# Patient Record
Sex: Male | Born: 1990 | Race: White | Hispanic: No | Marital: Single | Smoking: Current every day smoker
Health system: Southern US, Community
[De-identification: ages and names within clinical notes are randomized; demographics above are authoritative.]

## PROBLEM LIST (undated history)

## (undated) DIAGNOSIS — F329 Major depressive disorder, single episode, unspecified: Secondary | ICD-10-CM

## (undated) DIAGNOSIS — F32A Depression, unspecified: Secondary | ICD-10-CM

## (undated) HISTORY — DX: Depression, unspecified: F32.A

---
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
## 2018-08-02 ENCOUNTER — Other Ambulatory Visit: Payer: Self-pay

## 2018-08-02 ENCOUNTER — Emergency Department
Admission: EM | Admit: 2018-08-02 | Discharge: 2018-08-03 | Disposition: A | Discharge status: Home / Self Care | Payer: BC Managed Care – PPO | Attending: Student in an Organized Health Care Education/Training Program | Admitting: Student in an Organized Health Care Education/Training Program

## 2018-08-02 ENCOUNTER — Emergency Department: Payer: BC Managed Care – PPO

## 2018-08-02 DIAGNOSIS — W1839XA Other fall on same level, initial encounter: Secondary | ICD-10-CM | POA: Insufficient documentation

## 2018-08-02 DIAGNOSIS — E860 Dehydration: Secondary | ICD-10-CM

## 2018-08-02 DIAGNOSIS — R55 Syncope and collapse: Secondary | ICD-10-CM | POA: Insufficient documentation

## 2018-08-02 DIAGNOSIS — S46811A Strain of other muscles, fascia and tendons at shoulder and upper arm level, right arm, initial encounter: Secondary | ICD-10-CM | POA: Insufficient documentation

## 2018-08-02 DIAGNOSIS — S46911A Strain of unspecified muscle, fascia and tendon at shoulder and upper arm level, right arm, initial encounter: Secondary | ICD-10-CM

## 2018-08-02 DIAGNOSIS — F17210 Nicotine dependence, cigarettes, uncomplicated: Secondary | ICD-10-CM | POA: Insufficient documentation

## 2018-08-02 DIAGNOSIS — R05 Cough: Secondary | ICD-10-CM | POA: Insufficient documentation

## 2018-08-02 DIAGNOSIS — R1010 Upper abdominal pain, unspecified: Secondary | ICD-10-CM | POA: Insufficient documentation

## 2018-08-02 DIAGNOSIS — R059 Cough, unspecified: Secondary | ICD-10-CM

## 2018-08-02 DIAGNOSIS — Z716 Tobacco abuse counseling: Secondary | ICD-10-CM

## 2018-08-02 DIAGNOSIS — Y9301 Activity, walking, marching and hiking: Secondary | ICD-10-CM | POA: Insufficient documentation

## 2018-08-02 DIAGNOSIS — R112 Nausea with vomiting, unspecified: Secondary | ICD-10-CM | POA: Insufficient documentation

## 2018-08-02 LAB — CBC AND DIFFERENTIAL
Absolute NRBC: 0 10*3/uL (ref 0.00–0.00)
Basophils Absolute Automated: 0.13 10*3/uL — ABNORMAL HIGH (ref 0.00–0.08)
Basophils Automated: 0.8 %
Eosinophils Absolute Automated: 0.54 10*3/uL — ABNORMAL HIGH (ref 0.00–0.44)
Eosinophils Automated: 3.5 %
Hematocrit: 50.4 % — ABNORMAL HIGH (ref 37.6–49.6)
Hgb: 17.6 g/dL — ABNORMAL HIGH (ref 12.5–17.1)
Immature Granulocytes Absolute: 0.05 10*3/uL (ref 0.00–0.07)
Immature Granulocytes: 0.3 %
Lymphocytes Absolute Automated: 3.44 10*3/uL — ABNORMAL HIGH (ref 0.42–3.22)
Lymphocytes Automated: 22.5 %
MCH: 29.2 pg (ref 25.1–33.5)
MCHC: 34.9 g/dL (ref 31.5–35.8)
MCV: 83.7 fL (ref 78.0–96.0)
MPV: 9.4 fL (ref 8.9–12.5)
Monocytes Absolute Automated: 1.1 10*3/uL — ABNORMAL HIGH (ref 0.21–0.85)
Monocytes: 7.2 %
Neutrophils Absolute: 10.05 10*3/uL — ABNORMAL HIGH (ref 1.10–6.33)
Neutrophils: 65.7 %
Nucleated RBC: 0 /100 WBC (ref 0.0–0.0)
Platelets: 245 10*3/uL (ref 142–346)
RBC: 6.02 10*6/uL — ABNORMAL HIGH (ref 4.20–5.90)
RDW: 13 % (ref 11–15)
WBC: 15.31 10*3/uL — ABNORMAL HIGH (ref 3.10–9.50)

## 2018-08-02 LAB — BASIC METABOLIC PANEL
Anion Gap: 17 — ABNORMAL HIGH (ref 5.0–15.0)
BUN: 9 mg/dL (ref 9–28)
CO2: 18 mEq/L — ABNORMAL LOW (ref 22–29)
Calcium: 9.9 mg/dL (ref 8.5–10.5)
Chloride: 104 mEq/L (ref 100–111)
Creatinine: 1.1 mg/dL (ref 0.7–1.3)
Glucose: 112 mg/dL — ABNORMAL HIGH (ref 70–100)
Potassium: 4.1 mEq/L (ref 3.5–5.1)
Sodium: 139 mEq/L (ref 136–145)

## 2018-08-02 LAB — HEPATIC FUNCTION PANEL
ALT: 49 U/L (ref 0–55)
AST (SGOT): 29 U/L (ref 5–34)
Albumin/Globulin Ratio: 1.2 (ref 0.9–2.2)
Albumin: 4.6 g/dL (ref 3.5–5.0)
Alkaline Phosphatase: 128 U/L — ABNORMAL HIGH (ref 38–106)
Bilirubin Direct: 0.2 mg/dL (ref 0.0–0.5)
Bilirubin Indirect: 0.6 mg/dL (ref 0.2–1.0)
Bilirubin, Total: 0.8 mg/dL (ref 0.2–1.2)
Globulin: 3.7 g/dL — ABNORMAL HIGH (ref 2.0–3.6)
Protein, Total: 8.3 g/dL (ref 6.0–8.3)

## 2018-08-02 LAB — TROPONIN I: Troponin I: <0.01 ng/mL (ref 0.00–0.05)

## 2018-08-02 LAB — GFR: EGFR: >60

## 2018-08-02 LAB — LIPASE: Lipase: 18 U/L (ref 8–78)

## 2018-08-02 MED ORDER — SODIUM CHLORIDE 0.9 % IV BOLUS
1000.00 mL | Freq: Once | INTRAVENOUS | Status: AC
Start: 2018-08-02 — End: 2018-08-03
  Administered 2018-08-02: 23:00:00 1000 mL via INTRAVENOUS

## 2018-08-02 MED ORDER — ONDANSETRON HCL 4 MG/2ML IJ SOLN
4.00 mg | Freq: Once | INTRAMUSCULAR | Status: AC
Start: 2018-08-02 — End: 2018-08-02
  Administered 2018-08-02: 23:00:00 4 mg via INTRAVENOUS
  Filled 2018-08-02: qty 2

## 2018-08-02 MED ORDER — ACETAMINOPHEN 500 MG PO TABS
1000.0000 mg | ORAL_TABLET | Freq: Once | ORAL | Status: AC
Start: 2018-08-02 — End: 2018-08-02
  Administered 2018-08-02: 23:00:00 1000 mg via ORAL
  Filled 2018-08-02: qty 2

## 2018-08-02 NOTE — ED Provider Notes (Signed)
None        History     Chief Complaint   Patient presents with    Syncope    Emesis    Abdominal Pain     Pt with hx 18 yr smoker, chronic cough, presents with nausea, vomiting and syncopal episode today. He started feeling nauseous this morning. He  notes ~5 episodes of nonbloody nonbloody emesis and diffuse abd pain (greatest in RUQ). He has had a chronic cough for years which can be dry and/or productive. He thinks his cough was worsened slightly recently. He was having a coughing fit while walking to the bathroom and had a syncopal episode. He landed on his right side and notes Rt elbow pain. Unknown head injury but he denies scalp pain or wounds. He has chronic chest pain and notes the left parasternal region was painful today with coughing fits. No SOB or wheezing. No fever or bodyaches. No flank pain, urinary sxs or diarrhea. 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 No known sick contacts. No hx cardiac dz, MI, HTN, HLD, DM. No hx DVT/PE, leg edema. Pt lives 4 hours away and is here for work. He took Ibuprofen this morning, nothing this evening.     PCP: None    The history is provided by the patient.            History reviewed. No pertinent past medical history.    History reviewed. No pertinent surgical history.    History reviewed. No pertinent family history.    Social  Social History     Tobacco Use    Smoking status: Current Every Day Smoker     Packs/day: 2.00     Types: Cigarettes    Smokeless tobacco: Never Used   Substance Use Topics    Alcohol use: Yes     Frequency: Never    Drug use: Never       .     Allergies   Allergen Reactions    Shrimp [Shellfish Allergy] Anaphylaxis       Home Medications     Med List Status:  In Progress Set By: Gerhard Munch, RN at 08/02/2018 10:31 PM        No Medications           Review of Systems   All other systems reviewed and are negative.      Physical Exam    BP: 167/85, Heart Rate: (!) 110, Temp: 98.2 F (36.8 C), Resp Rate: 22,  SpO2: 95 %    Physical Exam  Vitals signs and nursing note reviewed.   Constitutional:       General: He is not in acute distress (nontoxic, speaking in full sentences, dry coughing fit during exam x 1 which activated his gag reflex but no vomiting).     Appearance: He is well-developed. He is obese. He is diaphoretic.   HENT:      Head: Normocephalic and atraumatic. No abrasion, contusion or laceration.      Comments: No scalp tenderness or deformity, no bleeding or wounds  Eyes:      General: Lids are normal.      Extraocular Movements: Extraocular movements intact.      Conjunctiva/sclera: Conjunctivae normal.      Pupils: Pupils are equal, round, and reactive to light.   Neck:      Musculoskeletal: Normal range of motion and neck supple.   Cardiovascular:      Rate and Rhythm: Normal  rate and regular rhythm.      Heart sounds: Normal heart sounds. No murmur. No friction rub. No gallop.    Pulmonary:      Effort: Pulmonary effort is normal. No respiratory distress.      Breath sounds: Normal breath sounds. No wheezing or rales.   Abdominal:      General: Abdomen is flat. Bowel sounds are normal. There is no distension.      Palpations: Abdomen is soft.      Tenderness: There is generalized abdominal tenderness (diffuse tenderness, greatest in LUE and epigastric region). There is no right CVA tenderness, left CVA tenderness, guarding or rebound.   Musculoskeletal: Normal range of motion.      Right shoulder: He exhibits normal range of motion and no tenderness.      Right elbow: He exhibits normal range of motion, no swelling, no effusion, no deformity and no laceration. Tenderness found. Olecranon process tenderness noted.      Right wrist: He exhibits normal range of motion, no tenderness, no bony tenderness and no swelling.   Skin:     General: Skin is warm.   Neurological:      General: No focal deficit present.      Mental Status: He is alert and oriented to person, place, and time.      GCS: GCS eye subscore  is 4. GCS verbal subscore is 5. GCS motor subscore is 6.      Cranial Nerves: Cranial nerves are intact.      Sensory: Sensation is intact.      Motor: Motor function is intact.      Coordination: Coordination is intact.      Gait: Gait is intact.           MDM and ED Course     ED Medication Orders (From admission, onward)    Start Ordered     Status Ordering Provider    08/02/18 2303 08/02/18 2302  sodium chloride 0.9 % bolus 1,000 mL  Once     Route: Intravenous  Ordered Dose: 1,000 mL     Last MAR action:  Stopped Trent Theisen L    08/02/18 2303 08/02/18 2302  ondansetron (ZOFRAN) injection 4 mg  Once     Route: Intravenous  Ordered Dose: 4 mg     Last MAR action:  Given Charo Philipp L    08/02/18 2303 08/02/18 2302  acetaminophen (TYLENOL) tablet 1,000 mg  Once     Route: Oral  Ordered Dose: 1,000 mg     Last MAR action:  Given Tanika Bracco L             MDM  Number of Diagnoses or Management Options  Cough:   Dehydration:   Elbow strain, right, initial encounter:   Encounter for smoking cessation counseling:   Non-intractable vomiting with nausea, unspecified vomiting type:   Pain of upper abdomen:   Vasovagal syncope:   Diagnosis management comments:   DDX: gastritis, GERD, PUD, viral gastroenteritis, food borne illness, SBO, cholelithiasis, acute cholecystitis, vasovagal syncope, arrhythmia, etc    .Discussed case with Dr. Manson Passey who agrees with ED workup. Will obtain RUQ [place]    Laboratory results reviewed by ED provider with patient and/or family:  Yes  Radiologic study results reviewed by ED provider with patient and/or family:  Yes  EKG: 104 sinus tachycardia, no ST or T wave changes      Re-eval: 1:30am- pt sitting in bed, watching TV, states  he's feeling better, nausea resolved, no vomiting here, abd pain improved, still mildly tender in the LUQ. Offered CT abdomen/pelvis however he is politely declining and would prefer to go home and will come back if it worsens. Tolerating PO. Reviewed all  blood work and imaging results with patient, including abnormal findings. Discussed treatment and need for close outpatient follow up with their PMD and/or referrals provided today to discuss these findings. Counseled the patient that he needs to quit smoking. All questions and concerns addressed. Strict return precautions given. Pt and/or family fully understand(s) and agrees to tx plan.                 Wells Score      Value   Previous DVT or PE  0   HR greater than 100  1.5   Surgery within 4 weeks, or Immobilization in last 3 days  0   Clinical Signs/Symptoms of DVT  0   Alternative diagnosis less likely than PE  0   Hemoptysis  0   Malignancy with treatment within 6 months or palliative care  0   Wells Score for PE  1.5          Procedures    Clinical Impression & Disposition     Clinical Impression  Final diagnoses:   Non-intractable vomiting with nausea, unspecified vomiting type   Pain of upper abdomen   Dehydration   Encounter for smoking cessation counseling   Cough   Vasovagal syncope   Elbow strain, right, initial encounter        ED Disposition     ED Disposition Condition Date/Time Comment    Discharge  Thu Aug 03, 2018  1:41 AM Alcide Clever discharge to home/self care.    Condition at disposition: Stable           New Prescriptions    BENZONATATE (TESSALON) 100 MG CAPSULE    Take 1 capsule (100 mg total) by mouth 3 (three) times daily as needed for Cough    ONDANSETRON (ZOFRAN-ODT) 4 MG DISINTEGRATING TABLET    Take 1 tablet (4 mg total) by mouth every 6 (six) hours as needed for Nausea                 Christene Slates, PA  08/03/18 0231       Rosamaria Lints, MD  08/03/18 [phone]

## 2018-08-03 ENCOUNTER — Emergency Department: Payer: BC Managed Care – PPO

## 2018-08-03 MED ORDER — BENZONATATE 100 MG PO CAPS
100.00 mg | ORAL_CAPSULE | Freq: Three times a day (TID) | ORAL | 0 refills | Status: AC | PRN
Start: 2018-08-03 — End: ?

## 2018-08-03 MED ORDER — ONDANSETRON 4 MG PO TBDP
4.00 mg | ORAL_TABLET | Freq: Four times a day (QID) | ORAL | 0 refills | Status: AC | PRN
Start: 2018-08-03 — End: ?

## 2018-08-03 NOTE — Discharge Instructions (Signed)
Dear Jonathan Ewing,    You were seen today by Josph Macho, PA-C. Thank you for choosing the [facility] for your healthcare needs.  We hope your visit today was EXCELLENT.    FOLLOW UP WITH YOUR PRIMARY DOCTOR OR THE REFERRAL(S) PROVIDED TODAY FOR REPEAT EVALUATION AND TO DISCUSS ANY ABNORMAL TEST RESULTS. TAKE A COPY OF YOUR TEST RESULTS WITH YOU TO YOUR APPOINTMENT. RETURN TO THE ER IF YOU DEVELOP WORSENING SYMPTOMS, NEW CONCERNING SYMPTOMS, OR YOU ARE UNABLE TO SEE YOUR DOCTOR IN 2 DAYS.     Please take any medications prescribed as directed.     If you have any questions or concerns, I am available at [phone]. Please do not hesitate to contact me if I can be of assistance.     Below is some information and resources that our patients often find helpful.    Sincerely,    Josph Macho, Physician Assistant  [facility]    ________________________________________________________________  Thank you for choosing [facility] for your emergency care needs.  We strive to provide EXCELLENT care to you and your family.      If you do not continue to improve, your condition worsens, or you develop new concerning symptoms please contact your doctor or return immediately to the Emergency Department.    DOCTOR REFERRALS  Call [phone] (available 24 hours a day, 7 days a week) if you need any further referrals and we can help you find a primary care doctor or specialist.  Also, available online at:  [email or web address]    YOUR CONTACT INFORMATION  Before leaving please check with registration to make sure we have an up-to-date contact number.  You can call registration at [phone] to update your information.  For questions about your hospital bill, please call [phone].  For questions about your Emergency Dept Physician bill please call [phone].      FREE HEALTH SERVICES  If you need help with  health or social services, please call 2-1-1 for a free referral to resources in your area.  2-1-1 is a free service connecting people with information on health insurance, free clinics, pregnancy, mental health, dental care, food assistance, housing, and substance abuse counseling.  Also, available online at:  http://[email or web address].org    MEDICAL RECORDS AND TESTS  Certain laboratory test results do not come back the same day, for example urine cultures.  We will contact you if other important findings are noted. Radiology films are often reviewed again to ensure accuracy.  If there is any discrepancy, we will notify you.    Please call [phone] to pick up a complimentary CD of any radiology studies performed.  If you or your doctor would like to request a copy of your medical records, please call [phone].      ORTHOPEDIC INJURY   Please know that significant injuries can exist even when an initial x-ray is read as normal or negative.  This can occur because some fractures (broken bones) are not initially visible on x-rays or with soft tissue injuries.  For this reason, close outpatient follow-up with your primary care doctor or bone specialist (orthopedist) is required.    MEDICATIONS AND FOLLOWUP  Please be aware that some prescription medications can cause drowsiness.  Use caution when driving or operating machinery.    The examination and treatment you have received in our Emergency Department is provided on an emergency basis, and  is not intended to be a substitute for your primary care physician.  It is important that your doctor checks you again and that you report any new or remaining problems at that time.      [place], [place], [place] [address] (1.4 miles, 7 minutes)  [place], [place],  [address] (6.5 miles, 13 minutes)  Handout with directions available on request.

## 2019-02-28 ENCOUNTER — Other Ambulatory Visit: Payer: Self-pay

## 2019-02-28 DIAGNOSIS — Z20822 Contact with and (suspected) exposure to covid-19: Secondary | ICD-10-CM

## 2019-03-02 LAB — NOVEL CORONAVIRUS, NAA: SARS-CoV-2, NAA: NOT DETECTED

## 2019-03-02 LAB — SPECIMEN STATUS REPORT

## 2019-03-28 ENCOUNTER — Other Ambulatory Visit: Payer: Self-pay

## 2019-03-28 ENCOUNTER — Emergency Department (HOSPITAL_COMMUNITY): Payer: BC Managed Care – PPO

## 2019-03-28 ENCOUNTER — Emergency Department (HOSPITAL_COMMUNITY)
Admission: EM | Admit: 2019-03-28 | Discharge: 2019-03-28 | Disposition: A | Discharge status: Home / Self Care | Payer: BC Managed Care – PPO | Attending: Emergency Medicine | Admitting: Emergency Medicine

## 2019-03-28 ENCOUNTER — Encounter (HOSPITAL_COMMUNITY): Payer: Self-pay | Admitting: Emergency Medicine

## 2019-03-28 DIAGNOSIS — M25512 Pain in left shoulder: Secondary | ICD-10-CM | POA: Insufficient documentation

## 2019-03-28 DIAGNOSIS — M25522 Pain in left elbow: Secondary | ICD-10-CM | POA: Diagnosis not present

## 2019-03-28 DIAGNOSIS — F17210 Nicotine dependence, cigarettes, uncomplicated: Secondary | ICD-10-CM | POA: Diagnosis not present

## 2019-03-28 DIAGNOSIS — S46912A Strain of unspecified muscle, fascia and tendon at shoulder and upper arm level, left arm, initial encounter: Secondary | ICD-10-CM

## 2019-03-28 DIAGNOSIS — S53402A Unspecified sprain of left elbow, initial encounter: Secondary | ICD-10-CM | POA: Diagnosis not present

## 2019-03-28 DIAGNOSIS — S56912A Strain of unspecified muscles, fascia and tendons at forearm level, left arm, initial encounter: Secondary | ICD-10-CM | POA: Diagnosis not present

## 2019-03-28 DIAGNOSIS — S59902A Unspecified injury of left elbow, initial encounter: Secondary | ICD-10-CM | POA: Diagnosis not present

## 2019-03-28 DIAGNOSIS — Y92410 Unspecified street and highway as the place of occurrence of the external cause: Secondary | ICD-10-CM | POA: Diagnosis not present

## 2019-03-28 DIAGNOSIS — S4992XA Unspecified injury of left shoulder and upper arm, initial encounter: Secondary | ICD-10-CM | POA: Diagnosis not present

## 2019-03-28 DIAGNOSIS — Y939 Activity, unspecified: Secondary | ICD-10-CM | POA: Insufficient documentation

## 2019-03-28 DIAGNOSIS — Y999 Unspecified external cause status: Secondary | ICD-10-CM | POA: Diagnosis not present

## 2019-03-28 MED ORDER — NAPROXEN 500 MG PO TABS: 500.0000 mg | ORAL_TABLET | Freq: Two times a day (BID) | ORAL | 0 refills | Status: DC

## 2019-03-28 MED ORDER — NAPROXEN 250 MG PO TABS
500.0000 mg | ORAL_TABLET | Freq: Once | ORAL | Status: AC
  Administered 2019-03-28: 13:00:00 500 mg via ORAL
  Filled 2019-03-28: qty 2
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
## 2019-03-28 NOTE — ED Notes (Signed)
Patient seen by EDP for initial assessment.
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
## 2019-03-28 NOTE — Discharge Instructions (Signed)
Xrays are normal.  Please take Naprosyn, 500mg  by mouth twice daily as needed for pain - this in an antiinflammatory medicine (NSAID) and is similar to ibuprofen - many people feel that it is stronger than ibuprofen and it is easier to take since it is a smaller pill.  Please use this only for 1 week - if your pain persists, you will need to follow up with your doctor in the office for ongoing guidance and pain control.    ER for worsening symptoms.  [facility] Primary Care Doctor List    Sinda Du MD. Specialty: Pulmonary Disease Contact information: [address]  [place] [address]  [phone]   Tula Nakayama, MD. Specialty: [facility] Medicine Contact information: [address] [address]  [phone]   Sallee Lange, MD. Specialty: [facility] Medicine Contact information: [place]  [place] [address]  [phone]   Rosita Fire, MD Specialty: Internal Medicine Contact information: [place] [address]  [phone]   Delphina Cahill, MD. Specialty: Internal Medicine Contact information: [place] [address]  [phone]    [facility] (Dr. Maudie Mercury) Specialty: Family Medicine Contact information: [place] [address]  [phone]   Leslie Andrea, MD. Specialty: [facility] Medicine Contact information: [place] [address]  [phone]   Asencion Noble, MD. Specialty: Internal Medicine Contact information: [address]  [place] [address]  [place]  [address] [place], [place] [address] [phone]  Services [facility] offers a variety of basic health services.  Services include but are not limited to: Blood pressure checks  Heart rate checks  Blood sugar checks  Urine analysis  Rapid  strep tests  Pregnancy tests.  Health education and referrals  People needing more complex services will be directed to a physician online. Using these virtual visits, doctors can evaluate and prescribe medicine and treatments. There will be no medication on-site, though [facility] will help patients fill their prescriptions at little to no cost.   For More information please go to: [email or web address]

## 2019-03-28 NOTE — ED Notes (Signed)
Patient transported to X-ray 

## 2019-04-10 DIAGNOSIS — M25512 Pain in left shoulder: Secondary | ICD-10-CM | POA: Diagnosis not present

## 2019-04-10 DIAGNOSIS — S53402A Unspecified sprain of left elbow, initial encounter: Secondary | ICD-10-CM | POA: Diagnosis not present

## 2019-04-11 ENCOUNTER — Ambulatory Visit (INDEPENDENT_AMBULATORY_CARE_PROVIDER_SITE_OTHER): Payer: BC Managed Care – PPO | Admitting: Family Medicine

## 2019-04-11 ENCOUNTER — Encounter: Payer: Self-pay | Admitting: Family Medicine

## 2019-04-11 ENCOUNTER — Other Ambulatory Visit: Payer: Self-pay

## 2019-04-11 VITALS — BP 120/82 | HR 99 | Temp 98.8°F | Resp 15 | Ht 70.0 in | Wt 257.0 lb

## 2019-04-11 DIAGNOSIS — Z720 Tobacco use: Secondary | ICD-10-CM | POA: Diagnosis not present

## 2019-04-11 DIAGNOSIS — M549 Dorsalgia, unspecified: Secondary | ICD-10-CM

## 2019-04-11 DIAGNOSIS — M62838 Other muscle spasm: Secondary | ICD-10-CM

## 2019-04-11 DIAGNOSIS — F322 Major depressive disorder, single episode, severe without psychotic features: Secondary | ICD-10-CM | POA: Diagnosis not present

## 2019-04-11 MED ORDER — BUPROPION HCL ER (XL) 150 MG PO TB24: 150.0000 mg | ORAL_TABLET | Freq: Every day | ORAL | 1 refills | Status: DC

## 2019-04-11 MED ORDER — CYCLOBENZAPRINE HCL 5 MG PO TABS: 5.0000 mg | ORAL_TABLET | Freq: Two times a day (BID) | ORAL | 0 refills | Status: DC | PRN

## 2019-04-11 MED ORDER — METHYLPREDNISOLONE ACETATE 80 MG/ML IJ SUSP
80.0000 mg | Freq: Once | INTRAMUSCULAR | Status: AC
  Administered 2019-04-11: 80 mg via INTRAMUSCULAR

## 2019-04-11 MED ORDER — KETOROLAC TROMETHAMINE 60 MG/2ML IM SOLN
60.0000 mg | Freq: Once | INTRAMUSCULAR | Status: AC
  Administered 2019-04-11: 16:00:00 60 mg via INTRAMUSCULAR
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
## 2019-04-11 NOTE — Patient Instructions (Addendum)
I appreciate the opportunity to provide you with care for your health and wellness. Today we discussed: mood and recent back pain  Follow up: 3.5 weeks  No labs or referrals today  Work note starting today for 2 weeks.  Injections today will help with pain and inflammation of back  Please start taking your medication as directed on bottle. Drink a full glass of water with each dose of any medication.  Stretch your back- look up videos on YouTube that can show you gentle yet effective stretching. Do this at least twice daily.  Please do not hesitate to call if you need to talk. We can help find you a therapist-if medication does not help like we hope.   Please continue to practice social distancing to keep you, your family, and our community safe.  If you must go out, please wear a mask and practice good handwashing.  It was a pleasure to see you and I look forward to continuing to work together on your health and well-being. Please do not hesitate to call the office if you need care or have questions about your care.  Have a wonderful day and week. With Gratitude, Cherly Beach, DNP, AGNP-BC    Back Exercises These exercises help to make your trunk and back strong. They also help to keep the lower back flexible. Doing these exercises can help to prevent back pain or lessen existing pain.  If you have back pain, try to do these exercises 2-3 times each day or as told by your doctor.  As you get better, do the exercises once each day. Repeat the exercises more often as told by your doctor.  To stop back pain from coming back, do the exercises once each day, or as told by your doctor. Exercises Single knee to chest Do these steps 3-5 times in a row for each leg: 1. Lie on your back on a firm bed or the floor with your legs stretched out. 2. Bring one knee to your chest. 3. Grab your knee or thigh with both hands and hold them it in place. 4. Pull on your knee until you feel a  gentle stretch in your lower back or buttocks. 5. Keep doing the stretch for 10-30 seconds. 6. Slowly let go of your leg and straighten it. Pelvic tilt Do these steps 5-10 times in a row: 1. Lie on your back on a firm bed or the floor with your legs stretched out. 2. Bend your knees so they point up to the ceiling. Your feet should be flat on the floor. 3. Tighten your lower belly (abdomen) muscles to press your lower back against the floor. This will make your tailbone point up to the ceiling instead of pointing down to your feet or the floor. 4. Stay in this position for 5-10 seconds while you gently tighten your muscles and breathe evenly. Cat-cow Do these steps until your lower back bends more easily: 1. Get on your hands and knees on a firm surface. Keep your hands under your shoulders, and keep your knees under your hips. You may put padding under your knees. 2. Let your head hang down toward your chest. Tighten (contract) the muscles in your belly. Point your tailbone toward the floor so your lower back becomes rounded like the back of a cat. 3. Stay in this position for 5 seconds. 4. Slowly lift your head. Let the muscles of your belly relax. Point your tailbone up toward the ceiling so your  back forms a sagging arch like the back of a cow. 5. Stay in this position for 5 seconds.  Press-ups Do these steps 5-10 times in a row: 1. Lie on your belly (face-down) on the floor. 2. Place your hands near your head, about shoulder-width apart. 3. While you keep your back relaxed and keep your hips on the floor, slowly straighten your arms to raise the top half of your body and lift your shoulders. Do not use your back muscles. You may change where you place your hands in order to make yourself more comfortable. 4. Stay in this position for 5 seconds. 5. Slowly return to lying flat on the floor.  Bridges Do these steps 10 times in a row: 1. Lie on your back on a firm surface. 2. Bend your  knees so they point up to the ceiling. Your feet should be flat on the floor. Your arms should be flat at your sides, next to your body. 3. Tighten your butt muscles and lift your butt off the floor until your waist is almost as high as your knees. If you do not feel the muscles working in your butt and the back of your thighs, slide your feet 1-2 inches farther away from your butt. 4. Stay in this position for 3-5 seconds. 5. Slowly lower your butt to the floor, and let your butt muscles relax. If this exercise is too easy, try doing it with your arms crossed over your chest. Belly crunches Do these steps 5-10 times in a row: 1. Lie on your back on a firm bed or the floor with your legs stretched out. 2. Bend your knees so they point up to the ceiling. Your feet should be flat on the floor. 3. Cross your arms over your chest. 4. Tip your chin a little bit toward your chest but do not bend your neck. 5. Tighten your belly muscles and slowly raise your chest just enough to lift your shoulder blades a tiny bit off of the floor. Avoid raising your body higher than that, because it can put too much stress on your low back. 6. Slowly lower your chest and your head to the floor. Back lifts Do these steps 5-10 times in a row: 1. Lie on your belly (face-down) with your arms at your sides, and rest your forehead on the floor. 2. Tighten the muscles in your legs and your butt. 3. Slowly lift your chest off of the floor while you keep your hips on the floor. Keep the back of your head in line with the curve in your back. Look at the floor while you do this. 4. Stay in this position for 3-5 seconds. 5. Slowly lower your chest and your face to the floor. Contact a doctor if:  Your back pain gets a lot worse when you do an exercise.  Your back pain does not get better 2 hours after you exercise. If you have any of these problems, stop doing the exercises. Do not do them again unless your doctor says it is  okay. Get help right away if:  You have sudden, very bad back pain. If this happens, stop doing the exercises. Do not do them again unless your doctor says it is okay. This information is not intended to replace advice given to you by your health care provider. Make sure you discuss any questions you have with your health care provider. Document Released: 05/01/2010 Document Revised: 12/22/2017 Document Reviewed: 12/22/2017 Elsevier Patient Education  [employer].

## 2019-04-12 DIAGNOSIS — Z720 Tobacco use: Secondary | ICD-10-CM | POA: Insufficient documentation

## 2019-04-12 DIAGNOSIS — M549 Dorsalgia, unspecified: Secondary | ICD-10-CM | POA: Insufficient documentation

## 2019-04-12 NOTE — Assessment & Plan Note (Signed)
Asked about quitting: confirms they are currently smokes cigarettes Advise to quit smoking: Educated about QUITTING to reduce the risk of cancer, cardio and cerebrovascular disease. Assess willingness: Unwilling to quit at this time, but is working on cutting back. Assist with counseling and pharmacotherapy: Counseled for 5 minutes and literature provided. Arrange for follow up:  not quitting follow up in 3 months and continue to offer help.   
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
## 2019-04-16 ENCOUNTER — Encounter: Payer: Self-pay | Admitting: Family Medicine

## 2019-04-18 ENCOUNTER — Telehealth: Payer: Self-pay

## 2019-04-18 NOTE — Telephone Encounter (Signed)
[place] Noted Sleeved copied

## 2019-04-19 ENCOUNTER — Other Ambulatory Visit: Payer: Self-pay | Admitting: Family Medicine

## 2019-04-19 DIAGNOSIS — M62838 Other muscle spasm: Secondary | ICD-10-CM

## 2019-04-19 DIAGNOSIS — M549 Dorsalgia, unspecified: Secondary | ICD-10-CM

## 2019-04-19 NOTE — Telephone Encounter (Signed)
I will place an order for PT for 4-6 weeks for Clayton Miller. Please call and let him know this.  Thank you. Advised to continue the medication, stretching and use heating pad.  Please get follow up with me within 3 weeks.

## 2019-04-25 ENCOUNTER — Encounter (HOSPITAL_COMMUNITY): Payer: Self-pay | Admitting: Physical Therapy

## 2019-04-25 ENCOUNTER — Encounter: Payer: Self-pay | Admitting: Family Medicine

## 2019-04-25 ENCOUNTER — Other Ambulatory Visit: Payer: Self-pay

## 2019-04-25 ENCOUNTER — Ambulatory Visit (HOSPITAL_COMMUNITY): Payer: BC Managed Care – PPO | Attending: Family Medicine | Admitting: Physical Therapy

## 2019-04-25 DIAGNOSIS — M542 Cervicalgia: Secondary | ICD-10-CM | POA: Diagnosis not present

## 2019-04-25 DIAGNOSIS — M6281 Muscle weakness (generalized): Secondary | ICD-10-CM | POA: Insufficient documentation

## 2019-04-25 DIAGNOSIS — M546 Pain in thoracic spine: Secondary | ICD-10-CM | POA: Diagnosis not present
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
## 2019-05-01 ENCOUNTER — Ambulatory Visit (INDEPENDENT_AMBULATORY_CARE_PROVIDER_SITE_OTHER): Payer: BC Managed Care – PPO | Admitting: Family Medicine

## 2019-05-01 ENCOUNTER — Encounter: Payer: Self-pay | Admitting: Family Medicine

## 2019-05-01 ENCOUNTER — Other Ambulatory Visit: Payer: Self-pay

## 2019-05-01 VITALS — BP 118/70 | HR 99 | Temp 98.5°F | Resp 15 | Ht 70.0 in | Wt 260.1 lb

## 2019-05-01 DIAGNOSIS — M549 Dorsalgia, unspecified: Secondary | ICD-10-CM

## 2019-05-01 DIAGNOSIS — F322 Major depressive disorder, single episode, severe without psychotic features: Secondary | ICD-10-CM

## 2019-05-01 DIAGNOSIS — M62838 Other muscle spasm: Secondary | ICD-10-CM | POA: Diagnosis not present

## 2019-05-01 MED ORDER — CYCLOBENZAPRINE HCL 5 MG PO TABS: 5.0000 mg | ORAL_TABLET | Freq: Two times a day (BID) | ORAL | 0 refills | Status: AC | PRN
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
## 2019-05-01 NOTE — Assessment & Plan Note (Signed)
pH Q9 has improved from 21-12.  Denies having any suicide or harm inside ideations.  Reports that he has had them in the past 3 months.  The last visit he reported in the last 12 months.  He denies having any specific plan of harm.  He does want to live for his daughter.  Is just trying to do his best reports that Wellbutrin helps him but he feels like he can feel angry outburst.  But is willing to continue it at this time.  Close follow-up.  Patient made aware suicide hotline and communicating if any of these situation should arise where he wants to harm himself.

## 2019-05-01 NOTE — Assessment & Plan Note (Signed)
As noted continues to have muscle spasms.  Provided with Flexeril refill and encouraged to continue physical therapy at this time.  We will see him back about a week or 2 before physical therapy is to decide if we need to do a referral to Ortho. Patient acknowledged agreement and understanding of the plan.
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
## 2019-05-01 NOTE — Patient Instructions (Addendum)
Happy New Year! May you have a year filled with hope, love, happiness and laughter.  I appreciate the opportunity to provide you with care for your health and wellness. Today we discussed: back pain, PT, and mood  Follow up: 05/10/2019 (please move this appt to Feb 12th or 16th)  No labs or referrals today  Continue PT  Call if you need anything.  I will get these forms completed and faxed in today.  Please continue to practice social distancing to keep you, your family, and our community safe.  If you must go out, please wear a mask and practice good handwashing.  It was a pleasure to see you and I look forward to continuing to work together on your health and well-being. Please do not hesitate to call the office if you need care or have questions about your care.  Have a wonderful day and week. With Gratitude, Tereasa Coop, DNP, AGNP-BC

## 2019-05-02 ENCOUNTER — Ambulatory Visit (HOSPITAL_COMMUNITY): Payer: BC Managed Care – PPO | Admitting: Physical Therapy

## 2019-05-02 ENCOUNTER — Encounter (HOSPITAL_COMMUNITY): Payer: Self-pay | Admitting: Physical Therapy

## 2019-05-02 DIAGNOSIS — M542 Cervicalgia: Secondary | ICD-10-CM

## 2019-05-02 DIAGNOSIS — M6281 Muscle weakness (generalized): Secondary | ICD-10-CM

## 2019-05-02 DIAGNOSIS — F322 Major depressive disorder, single episode, severe without psychotic features: Secondary | ICD-10-CM

## 2019-05-02 DIAGNOSIS — M546 Pain in thoracic spine: Secondary | ICD-10-CM | POA: Diagnosis not present
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
## 2019-05-02 NOTE — Patient Instructions (Addendum)
Axial Extension (Chin Tuck)    Pull chin in and lengthen back of neck. Hold __5__ seconds while counting out loud. Repeat _15___ times. Do _1-2___ sessions per day.  http://gt2.exer.us/449   Copyright  VHI. All rights reserved.  Double Knee to Chest (Flexion)    Gently pull both knees toward chest. Feel stretch in lower back or buttock area. Breathing deeply, Hold _30___ seconds. Repeat __3__ times. Do __1-2__ sessions per day.  http://gt2.exer.us/227   Copyright  VHI. All rights reserved.

## 2019-05-04 ENCOUNTER — Telehealth (HOSPITAL_COMMUNITY): Payer: Self-pay

## 2019-05-04 ENCOUNTER — Ambulatory Visit (HOSPITAL_COMMUNITY): Payer: BC Managed Care – PPO
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
## 2019-05-08 ENCOUNTER — Encounter (HOSPITAL_COMMUNITY): Payer: Self-pay | Admitting: Physical Therapy

## 2019-05-08 ENCOUNTER — Other Ambulatory Visit: Payer: Self-pay

## 2019-05-08 ENCOUNTER — Ambulatory Visit (HOSPITAL_COMMUNITY): Payer: BC Managed Care – PPO | Admitting: Physical Therapy

## 2019-05-08 DIAGNOSIS — M546 Pain in thoracic spine: Secondary | ICD-10-CM

## 2019-05-08 DIAGNOSIS — M542 Cervicalgia: Secondary | ICD-10-CM

## 2019-05-08 DIAGNOSIS — M6281 Muscle weakness (generalized): Secondary | ICD-10-CM
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
## 2019-05-09 ENCOUNTER — Ambulatory Visit (HOSPITAL_COMMUNITY): Payer: BC Managed Care – PPO | Admitting: Physical Therapy

## 2019-05-09 ENCOUNTER — Telehealth (HOSPITAL_COMMUNITY): Payer: Self-pay | Admitting: Physical Therapy

## 2019-05-09 NOTE — Telephone Encounter (Signed)
Called regarding patient not showing for appointment this date. Reminded of next scheduled appointment and clinic phone number. Left voicemail as there was no answer.  Verne Carrow PT, DPT 12:02 PM, 05/09/19 [phone]

## 2019-05-10 ENCOUNTER — Ambulatory Visit: Payer: BC Managed Care – PPO | Admitting: Family Medicine

## 2019-05-15 ENCOUNTER — Encounter (HOSPITAL_COMMUNITY): Payer: Self-pay | Admitting: Physical Therapy

## 2019-05-15 ENCOUNTER — Other Ambulatory Visit: Payer: Self-pay

## 2019-05-15 ENCOUNTER — Ambulatory Visit: Payer: BC Managed Care – PPO | Admitting: Family Medicine

## 2019-05-15 ENCOUNTER — Ambulatory Visit (HOSPITAL_COMMUNITY): Payer: Medicaid - Out of State | Attending: Family Medicine | Admitting: Physical Therapy

## 2019-05-15 DIAGNOSIS — M546 Pain in thoracic spine: Secondary | ICD-10-CM | POA: Diagnosis present

## 2019-05-15 DIAGNOSIS — M542 Cervicalgia: Secondary | ICD-10-CM | POA: Diagnosis present

## 2019-05-15 DIAGNOSIS — M6281 Muscle weakness (generalized): Secondary | ICD-10-CM | POA: Insufficient documentation
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
## 2019-05-17 ENCOUNTER — Other Ambulatory Visit: Payer: Self-pay

## 2019-05-17 ENCOUNTER — Ambulatory Visit (HOSPITAL_COMMUNITY): Payer: Medicaid - Out of State | Admitting: Physical Therapy

## 2019-05-17 ENCOUNTER — Encounter (HOSPITAL_COMMUNITY): Payer: Self-pay | Admitting: Physical Therapy

## 2019-05-17 DIAGNOSIS — M6281 Muscle weakness (generalized): Secondary | ICD-10-CM

## 2019-05-17 DIAGNOSIS — M542 Cervicalgia: Secondary | ICD-10-CM

## 2019-05-17 DIAGNOSIS — M546 Pain in thoracic spine: Secondary | ICD-10-CM | POA: Diagnosis not present
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
## 2019-05-22 ENCOUNTER — Other Ambulatory Visit: Payer: Self-pay

## 2019-05-22 ENCOUNTER — Ambulatory Visit (HOSPITAL_COMMUNITY): Payer: Medicaid - Out of State | Admitting: Physical Therapy

## 2019-05-22 ENCOUNTER — Encounter (HOSPITAL_COMMUNITY): Payer: Self-pay | Admitting: Physical Therapy

## 2019-05-22 DIAGNOSIS — M546 Pain in thoracic spine: Secondary | ICD-10-CM | POA: Diagnosis not present

## 2019-05-22 DIAGNOSIS — M542 Cervicalgia: Secondary | ICD-10-CM

## 2019-05-22 DIAGNOSIS — M6281 Muscle weakness (generalized): Secondary | ICD-10-CM
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
## 2019-05-24 ENCOUNTER — Telehealth (HOSPITAL_COMMUNITY): Payer: Self-pay | Admitting: Physical Therapy

## 2019-05-24 ENCOUNTER — Ambulatory Visit (HOSPITAL_COMMUNITY): Payer: Medicaid - Out of State | Admitting: Physical Therapy

## 2019-05-24 NOTE — Telephone Encounter (Signed)
Called regarding patient not showing up for appointment this date. There was no answer, so left a voicemail providing next appointment time and provided clinic phone number.  Verne Carrow PT, DPT 9:02 AM, 05/24/19 [phone]

## 2019-05-25 ENCOUNTER — Encounter: Payer: Self-pay | Admitting: Family Medicine

## 2019-05-25 ENCOUNTER — Ambulatory Visit (INDEPENDENT_AMBULATORY_CARE_PROVIDER_SITE_OTHER): Payer: BC Managed Care – PPO | Admitting: Family Medicine

## 2019-05-25 ENCOUNTER — Other Ambulatory Visit: Payer: Self-pay

## 2019-05-25 DIAGNOSIS — M549 Dorsalgia, unspecified: Secondary | ICD-10-CM

## 2019-05-25 DIAGNOSIS — F322 Major depressive disorder, single episode, severe without psychotic features: Secondary | ICD-10-CM

## 2019-05-25 DIAGNOSIS — M62838 Other muscle spasm: Secondary | ICD-10-CM

## 2019-05-25 MED ORDER — METHOCARBAMOL 500 MG PO TABS: 500.0000 mg | ORAL_TABLET | Freq: Two times a day (BID) | ORAL | 0 refills | Status: AC | PRN

## 2019-05-25 MED ORDER — BUPROPION HCL ER (XL) 300 MG PO TB24: 300.0000 mg | ORAL_TABLET | Freq: Every day | ORAL | 1 refills | Status: AC

## 2019-05-25 NOTE — Patient Instructions (Addendum)
Happy New Year! May you have a year filled with hope, love, happiness and laughter.  I appreciate the opportunity to provide you with care for your health and wellness. Today we discussed: on going back pain and depression  Follow up: 1st week of March  No labs   Referral to Ortho today  Lawyer/Work Note  Please continue to practice social distancing to keep you, your family, and our community safe.  If you must go out, please wear a mask and practice good handwashing.  It was a pleasure to see you and I look forward to continuing to work together on your health and well-being. Please do not hesitate to call the office if you need care or have questions about your care.  Have a wonderful day and week. With Gratitude, Tereasa Coop, DNP, AGNP-BC
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
## 2019-05-29 ENCOUNTER — Telehealth (HOSPITAL_COMMUNITY): Payer: Self-pay | Admitting: Physical Therapy

## 2019-05-29 ENCOUNTER — Ambulatory Visit (HOSPITAL_COMMUNITY): Payer: Medicaid - Out of State | Admitting: Physical Therapy

## 2019-05-29 NOTE — Telephone Encounter (Signed)
pt lmonvm to cancel today'sppt no reason given

## 2019-05-30 ENCOUNTER — Telehealth (HOSPITAL_COMMUNITY): Payer: Self-pay | Admitting: Physical Therapy

## 2019-05-30 NOTE — Telephone Encounter (Signed)
called the pt to cancel his appt.due to the weather. Lmonvm for a call back to confirm

## 2019-05-31 ENCOUNTER — Ambulatory Visit (HOSPITAL_COMMUNITY): Payer: Medicaid - Out of State | Admitting: Physical Therapy

## 2019-06-05 ENCOUNTER — Encounter (HOSPITAL_COMMUNITY): Payer: Self-pay | Admitting: Physical Therapy

## 2019-06-05 ENCOUNTER — Other Ambulatory Visit: Payer: Self-pay

## 2019-06-05 ENCOUNTER — Ambulatory Visit (HOSPITAL_COMMUNITY): Payer: Medicaid - Out of State | Admitting: Physical Therapy

## 2019-06-05 DIAGNOSIS — M6281 Muscle weakness (generalized): Secondary | ICD-10-CM

## 2019-06-05 DIAGNOSIS — M546 Pain in thoracic spine: Secondary | ICD-10-CM | POA: Diagnosis not present

## 2019-06-05 DIAGNOSIS — M542 Cervicalgia: Secondary | ICD-10-CM
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
## 2019-06-07 ENCOUNTER — Other Ambulatory Visit: Payer: Self-pay

## 2019-06-07 ENCOUNTER — Encounter (HOSPITAL_COMMUNITY): Payer: Self-pay | Admitting: Physical Therapy

## 2019-06-07 ENCOUNTER — Ambulatory Visit (HOSPITAL_COMMUNITY): Payer: Medicaid - Out of State | Admitting: Physical Therapy

## 2019-06-07 DIAGNOSIS — M546 Pain in thoracic spine: Secondary | ICD-10-CM

## 2019-06-07 DIAGNOSIS — M6281 Muscle weakness (generalized): Secondary | ICD-10-CM
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
## 2019-06-11 ENCOUNTER — Encounter: Payer: Self-pay | Admitting: Family Medicine

## 2019-06-14 ENCOUNTER — Encounter (HOSPITAL_COMMUNITY): Payer: Self-pay | Admitting: Physical Therapy

## 2019-06-14 ENCOUNTER — Ambulatory Visit (HOSPITAL_COMMUNITY): Payer: Medicaid - Out of State | Attending: Family Medicine | Admitting: Physical Therapy

## 2019-06-14 ENCOUNTER — Encounter: Payer: Self-pay | Admitting: Family Medicine

## 2019-06-14 ENCOUNTER — Other Ambulatory Visit: Payer: Self-pay

## 2019-06-14 ENCOUNTER — Ambulatory Visit (INDEPENDENT_AMBULATORY_CARE_PROVIDER_SITE_OTHER): Payer: Medicaid - Out of State | Admitting: Family Medicine

## 2019-06-14 DIAGNOSIS — M546 Pain in thoracic spine: Secondary | ICD-10-CM | POA: Insufficient documentation

## 2019-06-14 DIAGNOSIS — M549 Dorsalgia, unspecified: Secondary | ICD-10-CM | POA: Diagnosis not present

## 2019-06-14 DIAGNOSIS — M6281 Muscle weakness (generalized): Secondary | ICD-10-CM | POA: Insufficient documentation

## 2019-06-14 DIAGNOSIS — M542 Cervicalgia: Secondary | ICD-10-CM | POA: Diagnosis present
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
## 2019-06-14 NOTE — Patient Instructions (Signed)
I appreciate the opportunity to provide you with care for your health and wellness. Today we discussed: back pain  Follow up: 3 months   No labs or referrals today  Work note  I hope Dr Hilda Lias can help you some more with this.  Please continue to practice social distancing to keep you, your family, and our community safe.  If you must go out, please wear a mask and practice good handwashing.  It was a pleasure to see you and I look forward to continuing to work together on your health and well-being. Please do not hesitate to call the office if you need care or have questions about your care.  Have a wonderful day and week. With Gratitude, Tereasa Coop, DNP, AGNP-BC

## 2019-06-14 NOTE — Progress Notes (Signed)
Virtual Visit via Telephone Note   This visit type was conducted due to national recommendations for restrictions regarding the COVID-19 Pandemic (e.g. social distancing) in an effort to limit this patient's exposure and mitigate transmission in our community.  Due to his co-morbid illnesses, this patient is at least at moderate risk for complications without adequate follow up.  This format is felt to be most appropriate for this patient at this time.  The patient did not have access to video technology/had technical difficulties with video requiring transitioning to audio format only (telephone).  All issues noted in this document were discussed and addressed.  No physical exam could be performed with this format.    Evaluation Performed:  Follow-up visit  Date:  06/14/2019   ID:  Clayton Miller, DOB [birth year], MRN [id number]  Patient Location: Home Provider Location: Office  Location of Patient: Home Location of Provider: Telehealth Consent was obtain for visit to be over via telehealth. I verified that I am speaking with the correct person using two identifiers.  PCP:  Freddy Finner, NP   Chief Complaint: Back pain  History of Present Illness:    Clayton Miller is a 29 y.o. male with continues to have ongoing back pain even with physical therapy.  I have tried muscle relaxants as well.  Provide minimal to moderate relief.  Reports that the more movement he does the worse it gets.  He was referred to Dr. Sanjuan Dame office and has an appointment on March 9.  Hopefully he can help identify causes of his discomfort.  Please see previous note for further detail.  The patient does not have symptoms concerning for COVID-19 infection (fever, chills, cough, or new shortness of breath).   Past Medical, Surgical, Social History, Allergies, and Medications have been Reviewed.  Past Medical History:  Diagnosis Date  . Closed fracture of one or more phalanges of foot 02/21/2007  .  Depression    History reviewed. No pertinent surgical history.   Current Meds  Medication Sig  . acetaminophen (TYLENOL) 325 MG tablet Take 650 mg by mouth every 6 (six) hours as needed (pain).  Marland Kitchen buPROPion (WELLBUTRIN XL) 300 MG 24 hr tablet Take 1 tablet (300 mg total) by mouth daily.  . cyclobenzaprine (FLEXERIL) 5 MG tablet Take 1 tablet (5 mg total) by mouth 2 (two) times daily as needed for muscle spasms.  . methocarbamol (ROBAXIN) 500 MG tablet Take 1 tablet (500 mg total) by mouth 2 (two) times daily as needed for muscle spasms.     Allergies:   Shellfish allergy   ROS:   Please see the history of present illness.    All other systems reviewed and are negative.   Labs/Other Tests and Data Reviewed:    Recent Labs: No results found for requested labs within last 8760 hours.   Recent Lipid Panel No results found for: CHOL, TRIG, HDL, CHOLHDL, LDLCALC, LDLDIRECT  Wt Readings from Last 3 Encounters:  05/01/19 260 lb 1.3 oz (118 kg)  04/11/19 257 lb 0.6 oz (116.6 kg)  03/28/19 210 lb (95.3 kg)     Objective:    Vital Signs:  There were no vitals taken for this visit.   GEN:  Alert and oriented RESPIRATORY:  No shortness of breath noted in conversation PSYCH:  Flat affect and mood  ASSESSMENT & PLAN:    1. Acute back pain, unspecified back location, unspecified back pain laterality   Time:   Today, I have spent  10 minutes with the patient with telehealth technology discussing the above problems.     Medication Adjustments/Labs and Tests Ordered: Current medicines are reviewed at length with the patient today.  Concerns regarding medicines are outlined above.   Tests Ordered: No orders of the defined types were placed in this encounter.   Medication Changes: No orders of the defined types were placed in this encounter.   Disposition:  Follow up 3 months   Signed, Perlie Mayo, NP  06/14/2019 10:06 AM     [place]

## 2019-06-14 NOTE — Assessment & Plan Note (Signed)
Still is having ongoing back pain.  I have done physical therapy and muscle relaxers.  He is out on FMLA from work.  Referral to Dr. Hilda Lias has an appointment on March 9 hopefully we can figure out what is continuing to cause the discomfort.  Appreciate collaboration of care.

## 2019-06-15 ENCOUNTER — Ambulatory Visit (HOSPITAL_COMMUNITY): Payer: Medicaid - Out of State | Admitting: Physical Therapy

## 2019-06-15 ENCOUNTER — Telehealth (HOSPITAL_COMMUNITY): Payer: Self-pay | Admitting: Physical Therapy

## 2019-06-15 NOTE — Telephone Encounter (Signed)
Called regarding patient not showing for appointment this date. Explained that there may have been some confusion about whether this appointment was scheduled or not, but reminded patient of his next scheduled appointment and provided clinic phone number as needed.   Verne Carrow PT, DPT 9:36 AM, 06/15/19 [phone]

## 2019-06-19 ENCOUNTER — Ambulatory Visit: Payer: Medicaid - Out of State | Admitting: Orthopaedic Surgery

## 2019-06-19 ENCOUNTER — Encounter: Payer: Self-pay | Admitting: Orthopaedic Surgery

## 2019-06-20 ENCOUNTER — Telehealth (HOSPITAL_COMMUNITY): Payer: Self-pay | Admitting: Physical Therapy

## 2019-06-20 ENCOUNTER — Ambulatory Visit (HOSPITAL_COMMUNITY): Payer: Medicaid - Out of State | Admitting: Physical Therapy

## 2019-06-20 NOTE — Telephone Encounter (Signed)
Called regarding patient not showing for appointment this date. Reminded of next scheduled appointment and provided clinic phone number in case of needing to cancel.   Verne Carrow PT, DPT 8:56 AM, 06/20/19 [phone]

## 2019-06-20 NOTE — Telephone Encounter (Signed)
pt wants to cancel all appts due to he feels like he is not getting what he needs from his doctors and he wants to be discharged.

## 2019-06-21 ENCOUNTER — Ambulatory Visit (HOSPITAL_COMMUNITY): Payer: Medicaid - Out of State | Admitting: Physical Therapy

## 2019-06-22 ENCOUNTER — Encounter (HOSPITAL_COMMUNITY): Payer: Medicaid - Out of State | Admitting: Physical Therapy

## 2019-06-28 ENCOUNTER — Encounter (HOSPITAL_COMMUNITY): Payer: Medicaid - Out of State | Admitting: Physical Therapy

## 2019-06-29 ENCOUNTER — Encounter (HOSPITAL_COMMUNITY): Payer: Medicaid - Out of State | Admitting: Physical Therapy

## 2019-07-04 ENCOUNTER — Encounter (HOSPITAL_COMMUNITY): Payer: Medicaid - Out of State | Admitting: Physical Therapy

## 2019-07-06 ENCOUNTER — Encounter (HOSPITAL_COMMUNITY): Payer: Medicaid - Out of State | Admitting: Physical Therapy

## 2019-09-14 ENCOUNTER — Ambulatory Visit: Payer: Medicaid - Out of State | Admitting: Family Medicine

## 2019-12-27 ENCOUNTER — Encounter (HOSPITAL_COMMUNITY): Payer: Self-pay | Admitting: Physical Therapy

## 2019-12-27 NOTE — Therapy (Signed)
[place], [place], [address] Phone: [phone]   Fax:  [phone]  Patient Details  Name: Clayton Miller MRN: [id number] Date of Birth: 1991/01/29 Referring Provider:  No ref. provider found  Encounter Date: 12/27/2019  PHYSICAL THERAPY DISCHARGE SUMMARY  Visits from Start of Care: 9  Current functional level related to goals / functional outcomes: Unknown as patient did not return for re-assessment   Remaining deficits: Unknown as patient did not return for re-assessment   Education / Equipment: HEP Plan: Patient agrees to discharge.  Patient goals were partially met. Patient is being discharged due to the patient's request.  ?????    Clarene Critchley PT, DPT 11:02 AM, 12/27/19 [place], [place], [address] Phone: [phone]   Fax:  [phone]

## 2021-03-04 IMAGING — DX DG ELBOW COMPLETE 3+V*L*
4 series · 4 of 4 positions shown · non-contrast
Comparison: None.

CLINICAL DATA: Trauma and pain

EXAM:
LEFT ELBOW - COMPLETE 3+ VIEW

[elbow ap]
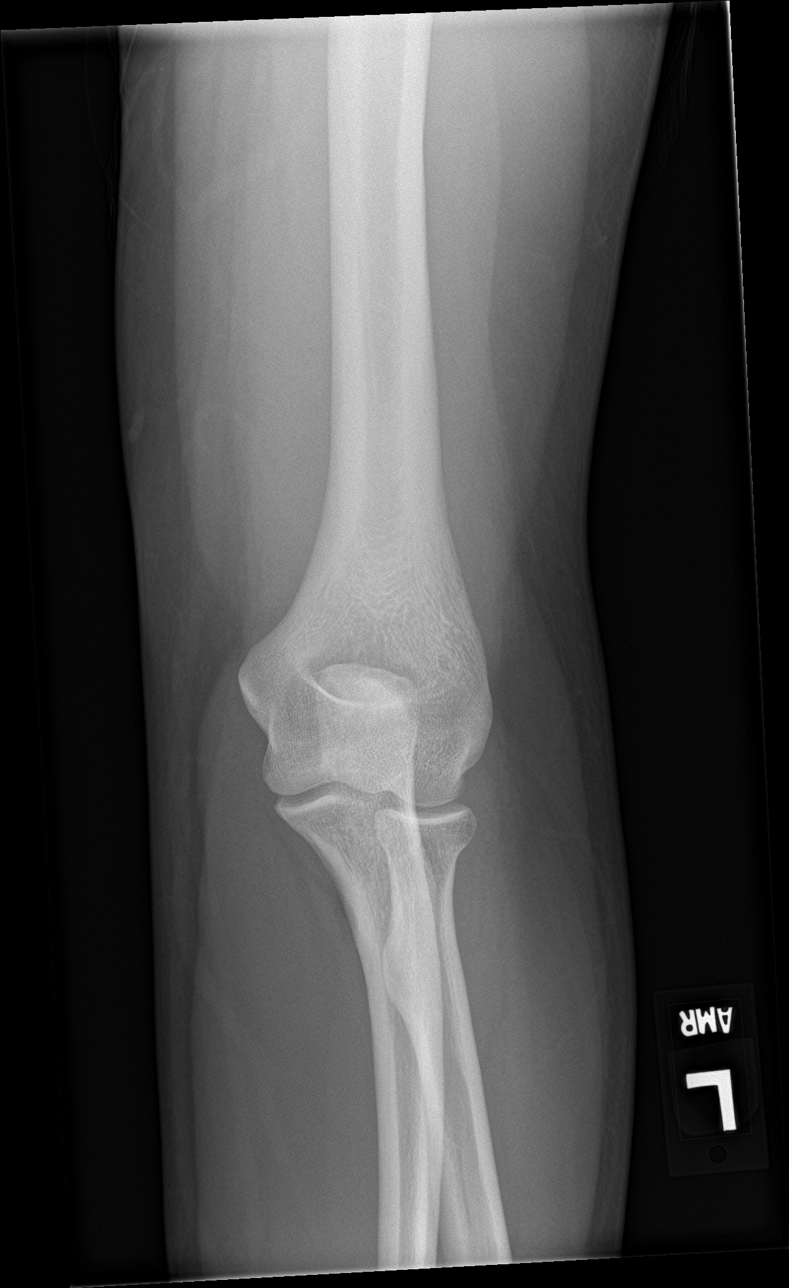

[elbow obl (1 of 2)]
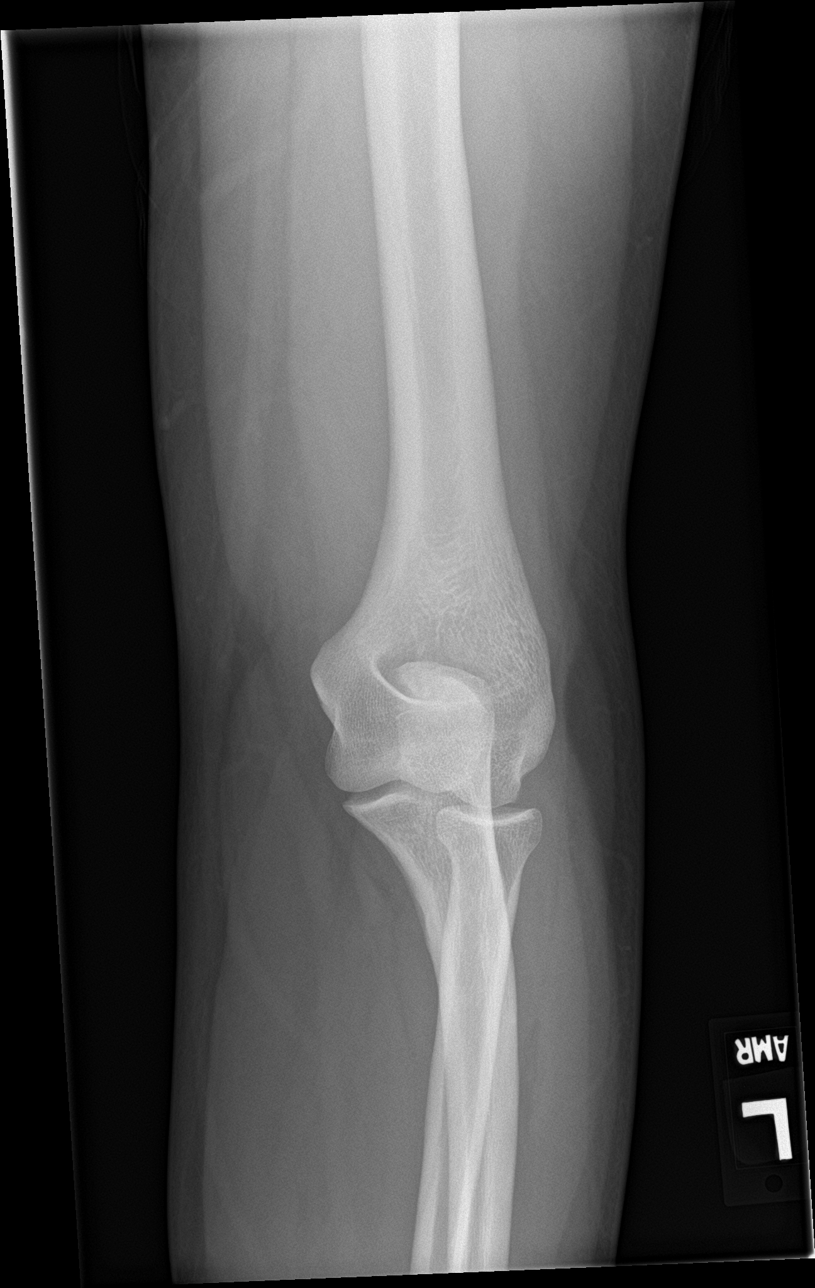

[elbow obl (2 of 2)]
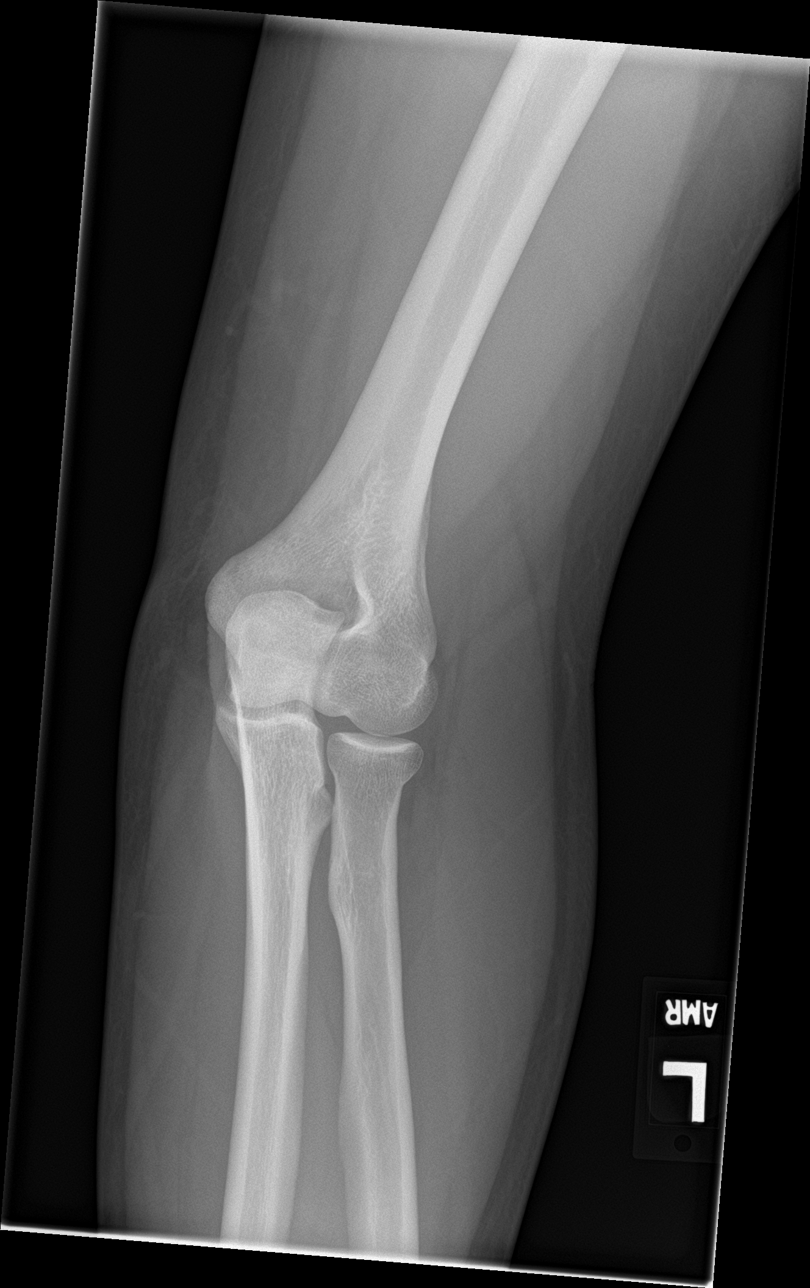

[elbow lat]
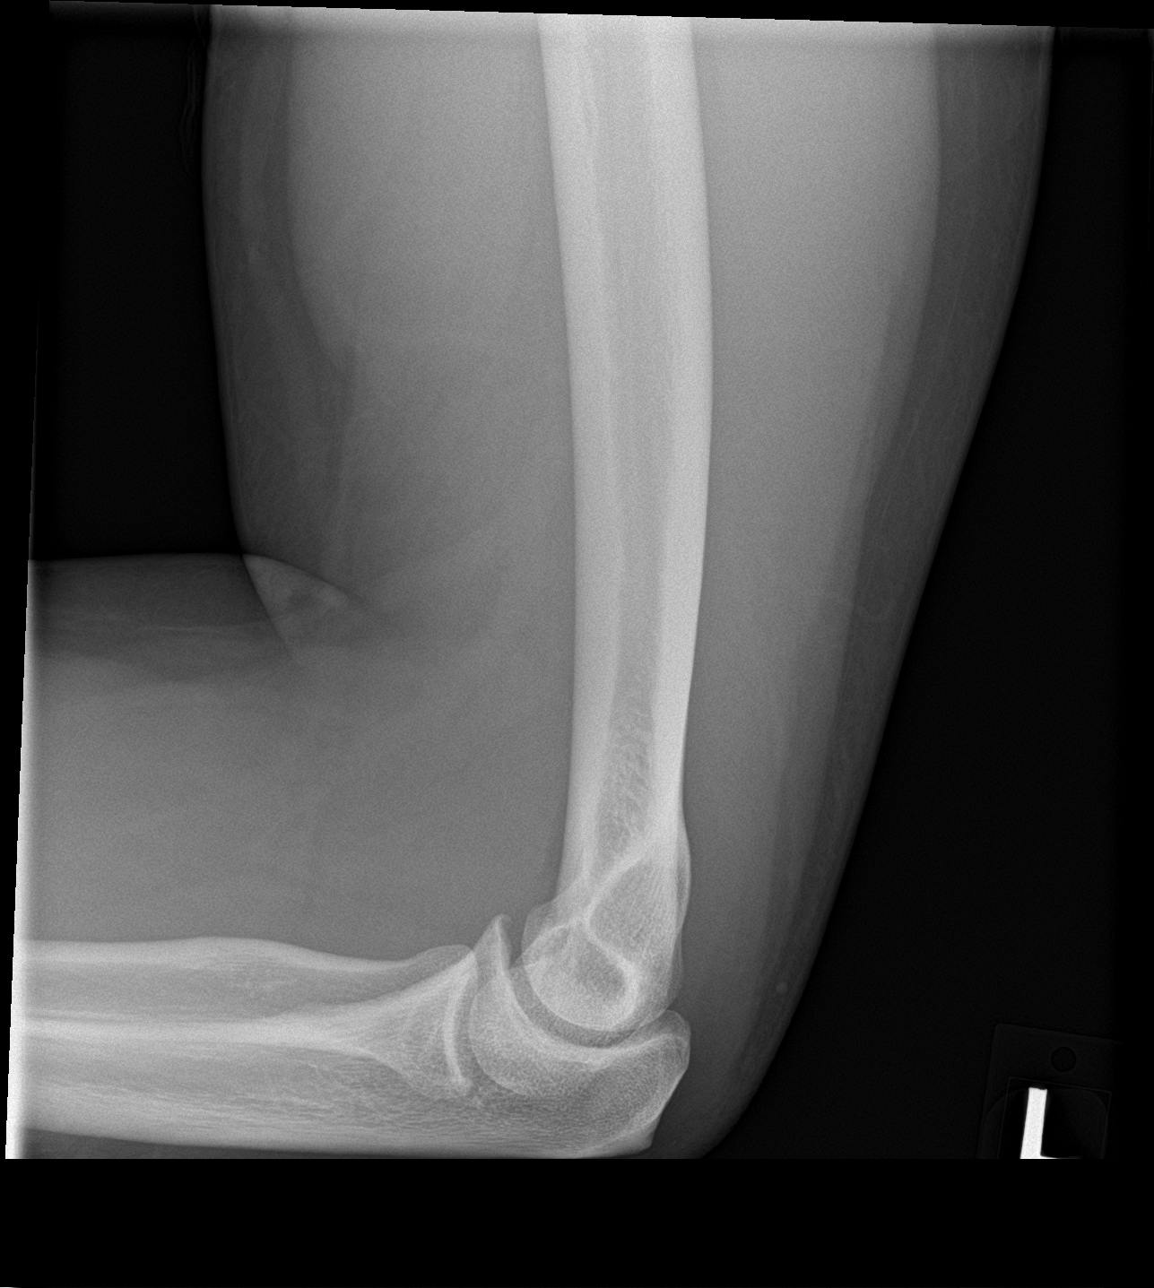

[4 of 4 positions shown; findings below may reference images not displayed]

FINDINGS: There is no evidence of fracture, dislocation, or joint effusion.
There is no evidence of arthropathy or other focal bone abnormality.
Soft tissues are unremarkable.
IMPRESSION: Negative.

## 2021-03-04 IMAGING — DX DG SHOULDER 2+V*L*
3 series · 3 of 3 positions shown · non-contrast
Comparison: None.

CLINICAL DATA: Trauma and pain

EXAM:
LEFT SHOULDER - 2+ VIEW

[shoulder grashey]
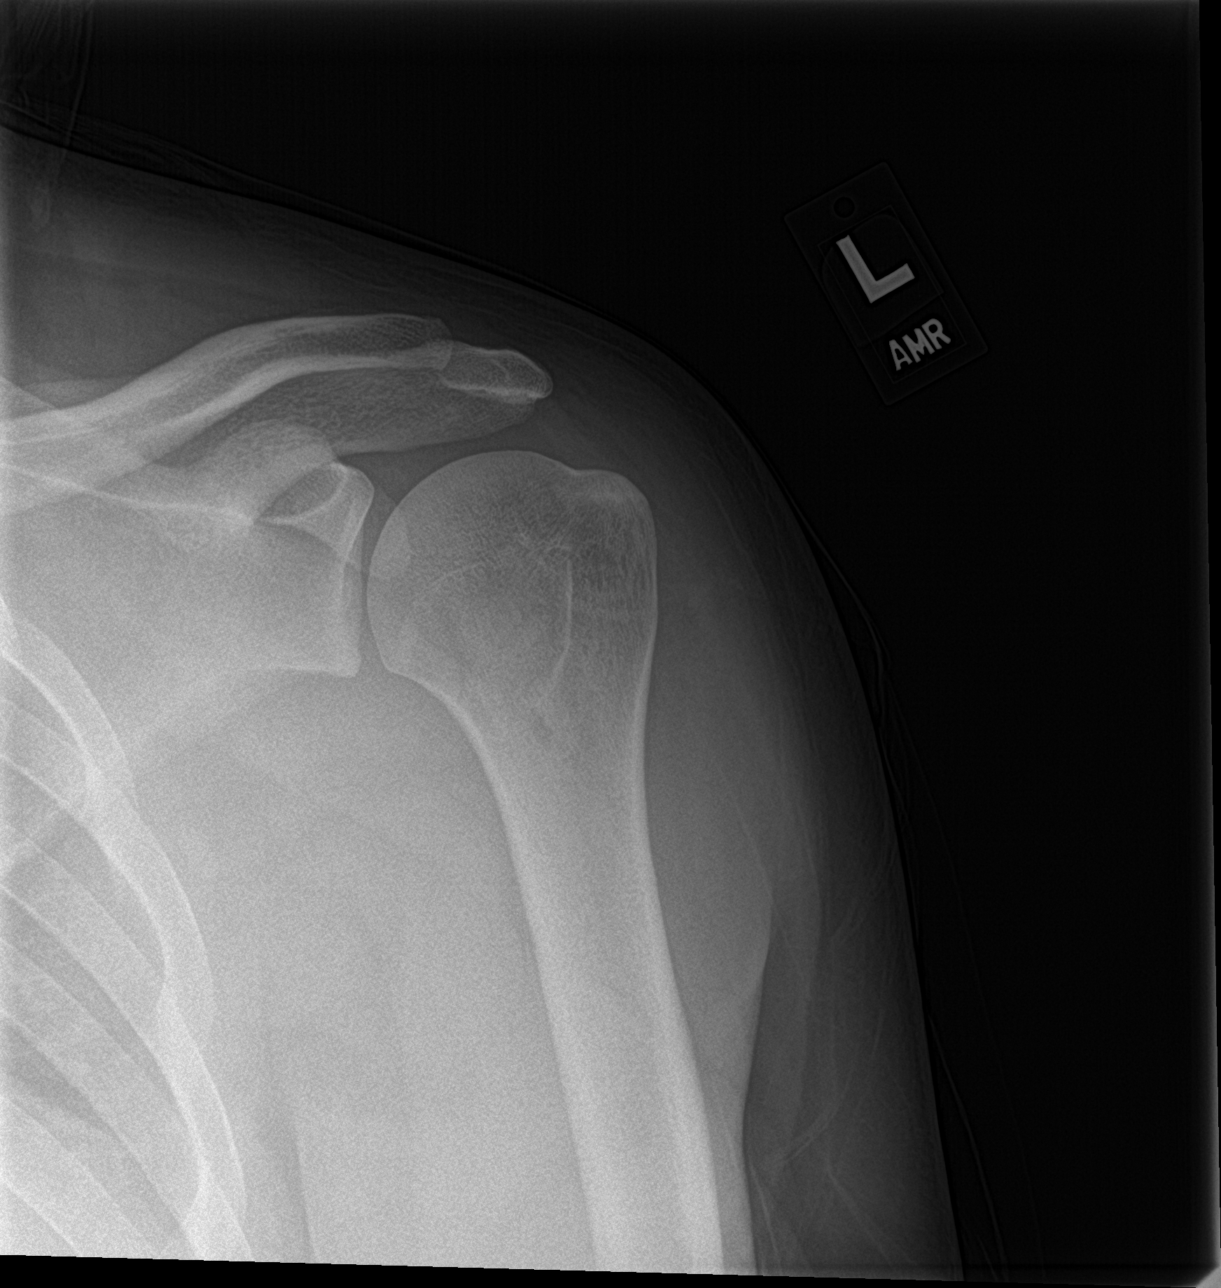

[shoulder y view]
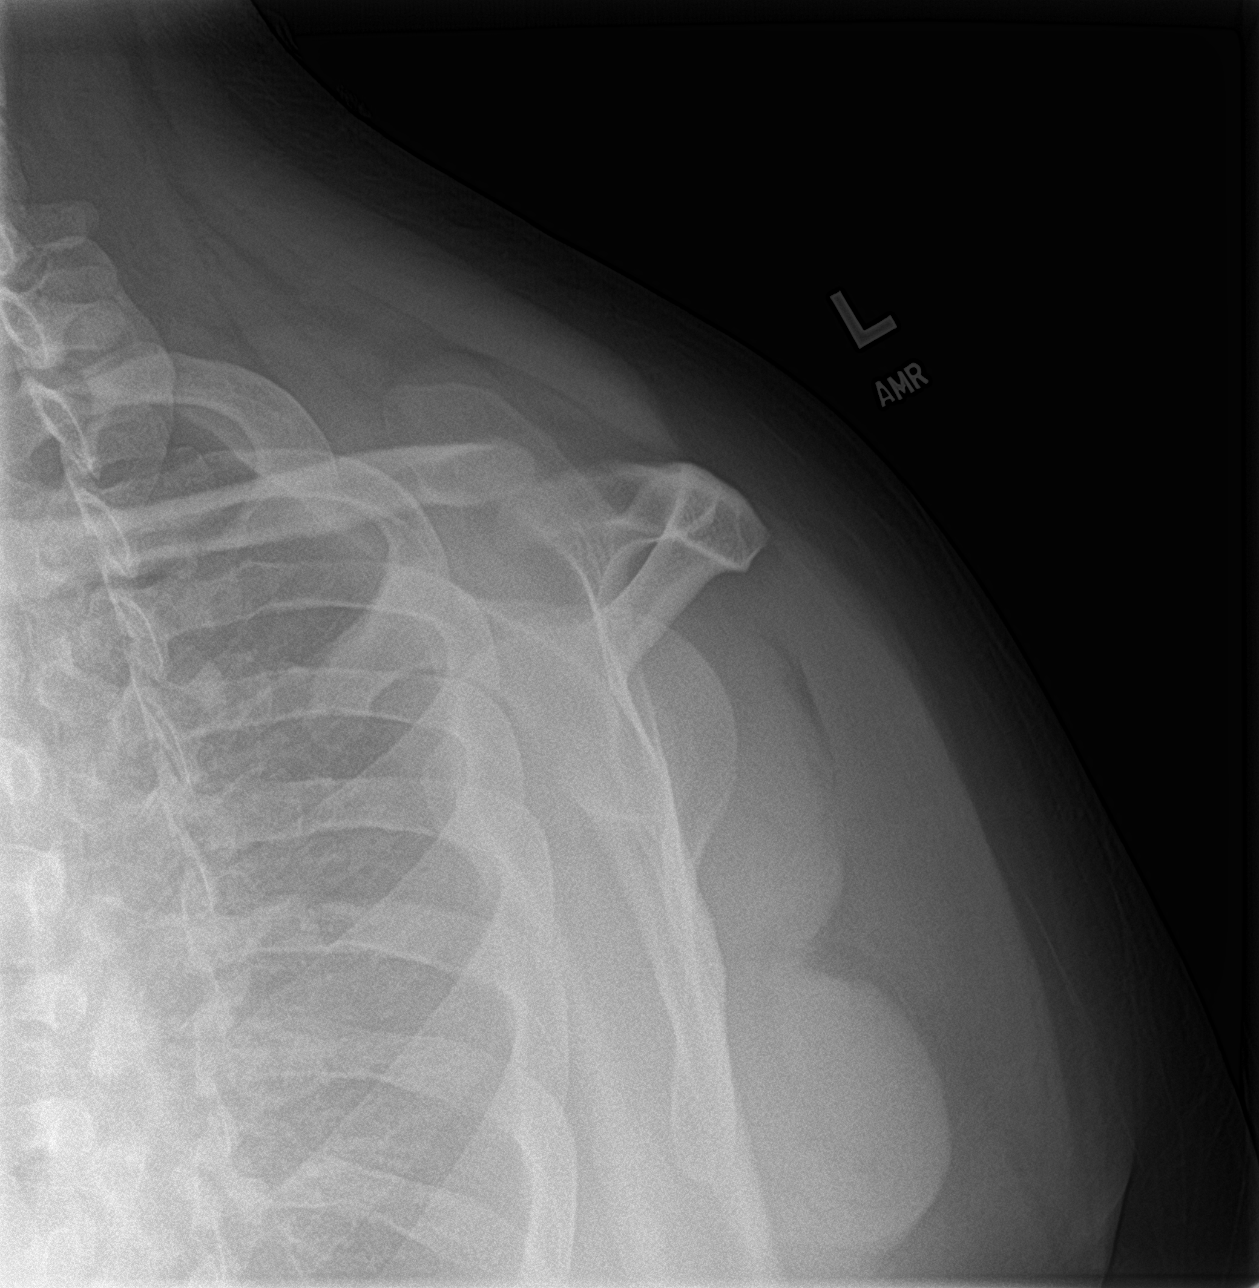

[shoulder axillary]
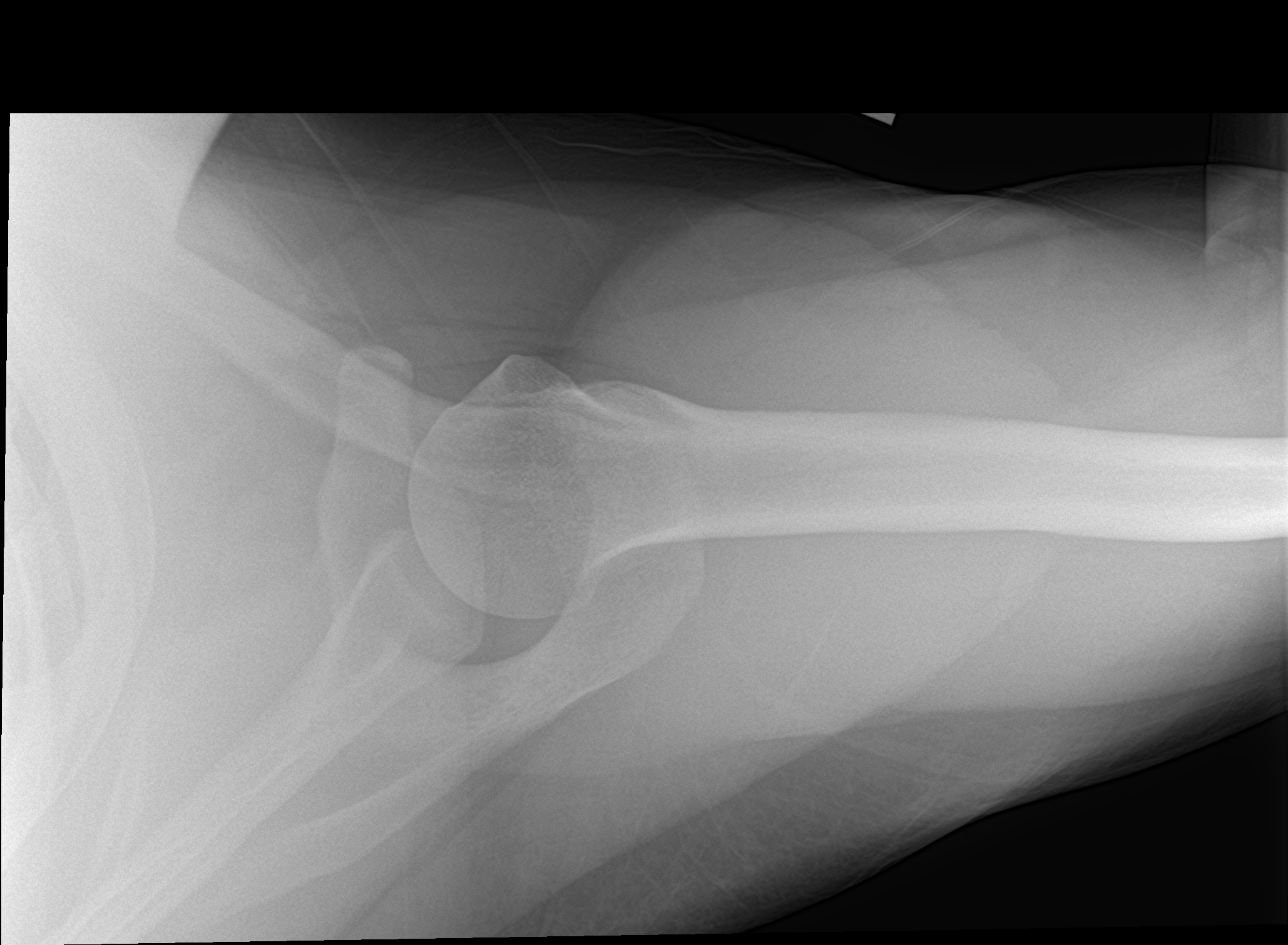

[3 of 3 positions shown; findings below may reference images not displayed]

FINDINGS: There is no evidence of fracture or dislocation. There is no
evidence of arthropathy or other focal bone abnormality. Soft
tissues are unremarkable.
IMPRESSION: Negative.

## 2022-06-10 ENCOUNTER — Encounter: Payer: Self-pay | Admitting: Radiology
